# Patient Record
Sex: Female | Born: 1950 | Race: White | Hispanic: No | Smoking: Current every day smoker
Health system: Southern US, Community
[De-identification: ages and names within clinical notes are randomized; demographics above are authoritative.]

## PROBLEM LIST (undated history)

## (undated) DIAGNOSIS — M797 Fibromyalgia: Secondary | ICD-10-CM

## (undated) DIAGNOSIS — N3281 Overactive bladder: Secondary | ICD-10-CM

## (undated) DIAGNOSIS — F32A Depression, unspecified: Secondary | ICD-10-CM

## (undated) DIAGNOSIS — R251 Tremor, unspecified: Secondary | ICD-10-CM

## (undated) DIAGNOSIS — F329 Major depressive disorder, single episode, unspecified: Secondary | ICD-10-CM

## (undated) DIAGNOSIS — F419 Anxiety disorder, unspecified: Secondary | ICD-10-CM

## (undated) DIAGNOSIS — Z923 Personal history of irradiation: Secondary | ICD-10-CM

## (undated) DIAGNOSIS — J449 Chronic obstructive pulmonary disease, unspecified: Secondary | ICD-10-CM

## (undated) DIAGNOSIS — E119 Type 2 diabetes mellitus without complications: Secondary | ICD-10-CM

## (undated) DIAGNOSIS — K219 Gastro-esophageal reflux disease without esophagitis: Secondary | ICD-10-CM

## (undated) DIAGNOSIS — I10 Essential (primary) hypertension: Secondary | ICD-10-CM

## (undated) DIAGNOSIS — C801 Malignant (primary) neoplasm, unspecified: Secondary | ICD-10-CM

## (undated) DIAGNOSIS — G4730 Sleep apnea, unspecified: Secondary | ICD-10-CM

## (undated) DIAGNOSIS — J45909 Unspecified asthma, uncomplicated: Secondary | ICD-10-CM

## (undated) HISTORY — PX: ULNAR NERVE TRANSPOSITION: SHX2595

## (undated) HISTORY — PX: ABDOMINAL HYSTERECTOMY: SHX81

---

## 2002-03-09 ENCOUNTER — Encounter: Payer: Self-pay | Admitting: Internal Medicine

## 2002-03-09 ENCOUNTER — Encounter: Admission: RE | Admit: 2002-03-09 | Discharge: 2002-03-09 | Payer: Self-pay | Admitting: Internal Medicine

## 2003-05-08 ENCOUNTER — Ambulatory Visit (HOSPITAL_COMMUNITY): Admission: RE | Admit: 2003-05-08 | Discharge: 2003-05-08 | Payer: Self-pay | Admitting: Geriatric Medicine

## 2003-10-19 ENCOUNTER — Inpatient Hospital Stay (HOSPITAL_COMMUNITY): Admission: EM | Admit: 2003-10-19 | Discharge: 2003-10-21 | Payer: Self-pay | Admitting: Cardiology

## 2005-03-20 ENCOUNTER — Encounter: Admission: RE | Admit: 2005-03-20 | Discharge: 2005-03-20 | Payer: Self-pay | Admitting: Internal Medicine

## 2007-05-04 ENCOUNTER — Ambulatory Visit: Payer: Self-pay | Admitting: Cardiology

## 2008-10-18 ENCOUNTER — Encounter: Admission: RE | Admit: 2008-10-18 | Discharge: 2008-10-18 | Payer: Self-pay | Admitting: Family Medicine

## 2010-07-19 NOTE — Cardiovascular Report (Signed)
NAMESANTRICE, [name]                          ACCOUNT NO.:  [id number]   MEDICAL RECORD NO.:  [id number]                   PATIENT TYPE:  INP   LOCATION:  4713                                 FACILITY:  [facility]   PHYSICIAN:  Charlies Constable, M.D. [facility]              DATE OF BIRTH:  1950/12/01   DATE OF PROCEDURE:  10/20/2003  DATE OF DISCHARGE:  10/21/2003                              CARDIAC CATHETERIZATION   PROCEDURES:  Cardiac catheterization.   CARDIOLOGIST:  Charlies Constable, M.D.   CLINICAL HISTORY:  [name] is 60 years old and is a smoker and has  hyperlipidemia.  She was admitted with chest pain by Dr. Sherril Croon and seen in  consultation by Dr. Simona Huh who recommended evaluation with coronary  angiography, and she was transferred here for this.   DESCRIPTION OF PROCEDURE:  The procedure was performed via the right femoral  artery using arterial sheath and 6-French preformed coronary catheters.  A  frontal arterial puncture was performed, and Omnipaque contrast was used.   The patient was enrolled in the [facility] trial, and after completion of  the diagnostic study, we prepared to perform intravascular ultrasound on the  right coronary artery.   The patient was given weight-adjusted heparin to prolong the ACT to greater  than 200 seconds.  We used A JR4 6-French guiding catheter with side holes  and a short Asahi soft wire.  We passed the wire down the distal right  coronary artery without difficulty.  After giving intracoronary  nitroglycerin, we passed an Atlantis IVUS catheter about two-thirds the way  down the vessel in the vicinity of the takeoff of a large right ventricular  branch.  We then did automatic pullback into the guiding catheter.  The  patient tolerated the procedure well and left the laboratory in satisfactory  condition.   RESULTS:  The aortic pressure was 129/67 with a mean of 94.  The left  ventricular pressure was 129/18.   1. LEFT MAIN CORONARY  ARTERY:  The left main coronary artery was free fo     significant disease.  2. LEFT ANTERIOR DESCENDING ARTERY:  The left anterior descending artery     gave rise to a septal perforator and two diagonal branches.  These and     the LAD proper were free of significant disease.  3. CIRCUMFLEX ARTERY:  The circumflex artery gave rise to two marginal     branches, an atrial branch, and a posterolateral branch.  These vessels     were free of significant disease.  4. RIGHT CORONARY ARTERY:  The right coronary artery gave rise to a conus     branch, two right ventricular branches, and acute marginal branch,     posterior descending branch, and a posterolateral branch.  The right     coronary artery was diffusely irregular.  There was 30% narrowing in the     mid  vessel.  There was 40% narrowing in the mid to distal vessel with     some mild calcification.  5. LEFT VENTRICULOGRAM: The left ventriculogram performed in the RAO     projection showed good wall motion with no areas of hypokinesis.   The intravascular ultrasound showed moderate calcified plaque in the mid to  distal vessel which narrowed the lumen by about 50%.  More proximally, there  was moderate uncalcified plaque.  The total IVUS run was approximately 62  mm, and the last 32 mm of the run was free of calcification.   CONCLUSION:  1. Nonobstructive coronary artery disease with  30% mid and 40% mid to     distal stenoses in the right coronary artery, no significant obstruction     in the circumflex and left anterior descending arteries, and normal left     ventricular function.  2. Intravascular ultrasound performed as part of the [employer] trial.   DISPOSITION:  The patient was returned to post catheterization area for  further observation.  If her IVUS qualifies as part of the [employer]  trial, she will be randomized to either rimonabant or a placebo with  followup catheterization scheduled for two years.                                                Charlies Constable, M.D. [facility]    BB/MEDQ  D:  10/20/2003  T:  10/21/2003  Job:  [id number]   cc:   Wende Crease, M.D.   Jonelle Sidle, M.D. [facility]   Cardiopulmonary Lab

## 2010-07-19 NOTE — Discharge Summary (Signed)
Charlotte Howell, Charlotte Howell                          ACCOUNT NO.:  [id number]   MEDICAL RECORD NO.:  [id number]                   PATIENT TYPE:  INP   LOCATION:  4713                                 FACILITY:  [facility]   PHYSICIAN:  Arturo Morton. Riley Kill, M.D. [facility]         DATE OF BIRTH:  1950/04/20   DATE OF ADMISSION:  10/19/2003  DATE OF DISCHARGE:  10/21/2003                           DISCHARGE SUMMARY - REFERRING   HISTORY OF PRESENT ILLNESS:  Charlotte Howell is a 60 year old white female who was  admitted to [facility] for observation by Dr. Sherril Croon with the recent  history of chest discomfort described as a pressure.  She stated that this  developed while driving home from work on the day prior to admission and  persisted throughout the evening and into the morning prompting further  assessment.  She has also noted some increased dysphagia with liquids and  solids, exacerbation of her chest discomfort with activity and also some  shortness of breath.   She has a history of GERD but no recent assessment.  Ongoing tobacco use,  hyperlipidemia treated with red yeast rice, and hypertension.   LABORATORY DATA:  At [facility], sodium was 138, potassium 4.3, BUN  8, creatinine 0.7, glucose 105.  Normal LFTs.  CK-MBs and troponins negative  for myocardial infarction.  PT 12.4, PTT 24.8.  H&H 14.5 and 42.7.  Normal  indices.  Platelets 268,000.  WBCs at 11.6.  Subsequent labs at [facility] showed a fasting lipid profile with a total cholesterol of  234, triglycerides 308, HDL 43, LDL 129.  Subsequent CK-MBs were also  negative for myocardial infarction.   Chest x-ray at [facility] was within normal limits.  EKG showed  normal sinus rhythm, normal axis, nonspecific ST and T-wave changes.   HOSPITAL COURSE:  Charlotte Howell was transferred to [facility]  for cardiac catheterization for definitive diagnosis.  She did have some  recurring chest discomfort  but she refused nitroglycerin.  On October 20, 2003, Dr. Charlies Constable performed cardiac catheterization.  This showed an EF  of 60%, mid-30% and 40% RCA lesions.  He felt she had nonobstructing  coronary artery disease.  This should be treated aggressively for cardiac  risk factors such as hyperlipidemia and smoking cessation.   By the morning of October 21, 2003, she was ambulating without difficulty.  Catheterization site was intact.  Outpatient taught tobacco cessation.  Consult will be obtained.   DISCHARGE DIAGNOSES:  1. Atypical chest discomfort with nonobstructive coronary artery disease     documented on cardiac catheterization.  2. Hypertension prior to admission.  3. Hyperlipidemia.  4. Tobacco use.  5. Gastroesophageal reflux disease with dysphagia.  6. She received new prescriptions for baby aspirin 81 mg q.d.  7. Zocor 40 mg q.h.s.  8. Wellbutrin 150 mg b.i.d. for seven weeks.  9. She was asked to continue her Zantac 150  mg b.i.d.  10.      Continue Propranolol, Albuterol, Advair as previously, and her red     yeast rice.   ACTIVITY:  She was advised no lifting, driving, sexual activity, or heavy  exertion for two days.   DIET:  Maintain low salt, fat, cholesterol diet.   DISCHARGE INSTRUCTIONS:  She was advised if she had any problems with her  catheterization site to please call our [facility] office for problems.  She is  advised no smoking or tobacco use and explained the philosophy behind  Wellbutrin.  She was asked to arrange a follow-up one to two week  appointment with Dr. Forrest Moron.  At the time of follow-up with Dr. Forrest Moron, her primary care physician, consideration should be given to  arrangements of six to eight weeks lipid and liver check since Zocor was  initiated as well as outpatient carotid dopplers.  Dr. Jonelle Sidle  felt that he possibly heard a carotid bruit on exam; however, this was not  appreciated at the time of discharge.       Charlotte Howell, P.A. [facility]                    Thomas D. Riley Kill, M.D. [facility]    EW/MEDQ  D:  10/21/2003  T:  10/21/2003  Job:  [id number]   cc:   Sherril Croon, M.D.  [place], [place]   Jonelle Sidle, M.D. [facility]   Forrest Moron, M.D.  [place], [place]

## 2014-07-14 ENCOUNTER — Other Ambulatory Visit: Payer: Self-pay | Admitting: Surgery

## 2014-07-14 ENCOUNTER — Ambulatory Visit: Payer: Self-pay | Admitting: Surgery

## 2014-07-14 DIAGNOSIS — C50911 Malignant neoplasm of unspecified site of right female breast: Secondary | ICD-10-CM

## 2014-07-14 DIAGNOSIS — R922 Inconclusive mammogram: Secondary | ICD-10-CM

## 2014-07-14 DIAGNOSIS — Z853 Personal history of malignant neoplasm of breast: Secondary | ICD-10-CM

## 2014-07-18 ENCOUNTER — Ambulatory Visit
Admission: RE | Admit: 2014-07-18 | Discharge: 2014-07-18 | Disposition: A | Discharge status: Home / Self Care | Payer: PRIVATE HEALTH INSURANCE | Source: Ambulatory Visit | Attending: Surgery | Admitting: Surgery

## 2014-07-18 MED ORDER — GADOBENATE DIMEGLUMINE 529 MG/ML IV SOLN
15.0000 mL | Freq: Once | INTRAVENOUS | Status: AC | PRN
  Administered 2014-07-18: 15 mL via INTRAVENOUS

## 2014-07-21 ENCOUNTER — Other Ambulatory Visit: Payer: Self-pay | Admitting: Surgery

## 2014-07-21 DIAGNOSIS — R928 Other abnormal and inconclusive findings on diagnostic imaging of breast: Secondary | ICD-10-CM

## 2014-08-22 ENCOUNTER — Ambulatory Visit
Admission: RE | Admit: 2014-08-22 | Discharge: 2014-08-22 | Disposition: A | Discharge status: Home / Self Care | Payer: PRIVATE HEALTH INSURANCE | Source: Ambulatory Visit | Attending: Surgery | Admitting: Surgery

## 2014-08-22 ENCOUNTER — Other Ambulatory Visit: Payer: Self-pay | Admitting: Surgery

## 2014-08-22 DIAGNOSIS — R928 Other abnormal and inconclusive findings on diagnostic imaging of breast: Secondary | ICD-10-CM

## 2014-08-22 MED ORDER — GADOBENATE DIMEGLUMINE 529 MG/ML IV SOLN
15.0000 mL | Freq: Once | INTRAVENOUS | Status: AC | PRN
  Administered 2014-08-22: 15 mL via INTRAVENOUS

## 2014-08-24 ENCOUNTER — Other Ambulatory Visit: Payer: PRIVATE HEALTH INSURANCE

## 2014-08-29 ENCOUNTER — Ambulatory Visit: Payer: Self-pay | Admitting: Surgery

## 2014-08-29 DIAGNOSIS — C50911 Malignant neoplasm of unspecified site of right female breast: Secondary | ICD-10-CM

## 2014-08-29 NOTE — H&P (Signed)
[facility] 08/29/2014 4:11 PM Location: [facility] Patient #: [id number] DOB: 11/15/1950 Widowed / Language: [name] / Race: White Female History of Present Illness [name] A. [name] MD; 08/29/2014 5:07 PM) Patient words: discuss surgery  PT HERE FOR FOLLOW UP OF STAGE 1 RIGHT BREAST CANCER. SHE HAD 2 MORE RIGHT BREAST BIOPSIES AND THESE WERE FIBROCYSTIC DISEASE. sHE WOULD LIKE TO PROCEED WITH RIGHT BREAST PARTIAL MASTECTOMY AND sln MAPPING OVER MASTECTOMY.            1. Breast, right, needle core biopsy, UOQ - FIBROCYSTIC CHANGES. - THERE IS NO EVIDENCE OF MALIGNANCY. - SEE COMMENT. 2. Breast, right, needle core biopsy, UIQ - FIBROADENOMA. - THERE IS NO EVIDENCE OF MALIGNANCY. - SEE COMMENT.             CLINICAL DATA: Newly diagnosed right breast 12 o'clock location malignancy at [place], [place].  LABS: BUN and creatinine were obtained on site at [place] at  [address]  Results: BUN for mg/dL, Creatinine 0.7 mg/dL.  EXAM: BILATERAL BREAST MRI WITH AND WITHOUT CONTRAST  TECHNIQUE: Multiplanar, multisequence MR images of both breasts were obtained prior to and following the intravenous administration of 15 ml of MultiHance.  THREE-DIMENSIONAL MR IMAGE RENDERING ON INDEPENDENT WORKSTATION:  Three-dimensional MR images were rendered by post-processing of the original MR data on an independent workstation. The three-dimensional MR images were interpreted, and findings are reported in the following complete MRI report for this study. Three dimensional images were evaluated at the independent DynaCad workstation  COMPARISON: Outside prior mammograms and right breast ultrasound  FINDINGS: Breast composition: d. Extreme fibroglandular tissue.  Background parenchymal enhancement: Mild  Right breast: In the right breast 12 o'clock location is an area of irregular masslike enhancement measuring 1.9 by  0.6 x 1.4 cm, demonstrating progressive enhancement type kinetics. This contains central clip artifact and corresponds to the biopsy-proven malignancy. In the right breast 10 o'clock location, 1 cm lateral to this biopsy proven malignancy, is a 0.6 cm area of irregular masslike enhancement also demonstrating similar progressive enhancement type kinetics.  Left breast: No mass or abnormal enhancement.  Lymph nodes: No abnormal appearing lymph nodes.  Ancillary findings: None.  IMPRESSION: Dominant 1.9 cm area of masslike abnormal enhancement right breast 12 o'clock location corresponding to biopsy-proven malignancy.  In the right breast 10 o'clock location, located 1 cm lateral to the dominant right breast 12 o'clock location mass, is a 0.6 cm irregular area of masslike enhancement. This could represent multi focal malignancy or reactive versus metastatic intramammary lymph node. Consider MRI guided breast biopsy if breast conservation is considered. The patient previously underwent sonographic evaluation of this general location and no sonographic abnormality was reportedly previously seen.  No MRI evidence for malignancy in the left breast.  RECOMMENDATION: MRI guided right breast biopsy  BI-RADS CATEGORY 6: Known biopsy-proven malignancy.   Electronically Signed By: [name] M.D. On: 07/18/2014 15:40.  The patient is a 64 year old female   Medication History Ventura Sellers, CMA; 08/29/2014 4:16 PM) ALPRAZolam (1MG  Tablet, Oral) Active. TraMADol HCl (50MG  Tablet, Oral) Active. Cyclobenzaprine HCl (10MG  Tablet, Oral) Active. Dexilant (60MG  Capsule DR, Oral) Active. Myrbetriq (25MG  Tablet ER 24HR, Oral) Active. Primidone (50MG  Tablet, Oral) Active. ROPINIRole HCl (0.5MG  Tablet, Oral) Active. Symbicort (160-4.5MCG/ACT Aerosol, Inhalation) Active. ZyrTEC Allergy (10MG  Capsule, Oral) Active. Albuterol Sulfate (0.63MG /3ML Nebulized Soln,  Inhalation) Active. Aspirin (81MG  Tablet, Oral) Active. Tylenol Extra Strength (500MG  Tablet, Oral) Active. Hydrochlorothiazide (25MG  Tablet, Oral) Active.  MetFORMIN HCl (500MG  Tablet, Oral two times daily) Active. Red Yeast Rice Extract (600MG  Capsule, Oral daily) Active. Medications Reconciled    Vitals [name] CMA; 08/29/2014 4:12 PM) 08/29/2014 4:12 PM Weight: 159.8 lb Height: 64in Body Surface Area: 1.81 m Body Mass Index: 27.43 kg/m Temp.: 97.7F(Oral)  Pulse: 100 (Regular)  BP: 138/76 (Sitting, Left Arm, Standard)     Physical Exam ([name] A. Katiria Calame MD; 08/29/2014 5:08 PM)  General Mental Status-Alert. General Appearance-Consistent with stated age. Hydration-Well hydrated. Voice-Normal.  Eye Eyeball - Bilateral-Extraocular movements intact. Sclera/Conjunctiva - Bilateral-No scleral icterus.  Breast Note: NOT REEXAMINED TODAY   Neurologic Neurologic evaluation reveals -alert and oriented x 3 with no impairment of recent or remote memory. Mental Status-Normal.  Musculoskeletal Normal Exam - Left-Upper Extremity Strength Normal and Lower Extremity Strength Normal. Normal Exam - Right-Upper Extremity Strength Normal and Lower Extremity Strength Normal.  Lymphatic Note: NO CHANGE FROM PREVIOUS EXAM     Assessment & Plan ([name] A. Zoria Rawlinson MD; 08/29/2014 5:09 PM)  BREAST CANCER, RIGHT (174.9  C50.911) Impression: 1.9 CM ON MRI er/pr + UPPER OUTER QUADRANT THE OTHER SITES IN RIGHT BREAST ARE FIBROCYSTIC DISEASE PT DESIRES BREAST CONSERVATION AND WANTS HER CARE IN [place] WILL REFER TO DR Tressie Stalker IN [place] AND CAN GET RADIATION THERAPY IN [place]    Risk of lumpectomy include bleeding, infection, seroma, more surgery, use of seed/wire, wound care, cosmetic deformity and the need for other treatments, death , blood clots, death. Pt agrees to proceed. Risk of sentinel lymph node mapping include bleeding,  infection, lymphedema, shoulder pain. stiffness, dye allergy. cosmetic deformity , blood clots, death, need for more surgery. Pt agres to proceed.  Current Plans Pt Education - Breast Cancer: discussed with patient and provided information. Pt Education - CCS Breast Biopsy HCI Pt Education - CSS Breast Biopsy Instructions (FLB): discussed with patient and provided information.

## 2014-09-13 ENCOUNTER — Other Ambulatory Visit: Payer: Self-pay | Admitting: Surgery

## 2014-09-13 DIAGNOSIS — C50911 Malignant neoplasm of unspecified site of right female breast: Secondary | ICD-10-CM

## 2014-09-18 ENCOUNTER — Encounter (HOSPITAL_BASED_OUTPATIENT_CLINIC_OR_DEPARTMENT_OTHER): Payer: Self-pay | Admitting: *Deleted

## 2014-09-20 ENCOUNTER — Encounter (HOSPITAL_BASED_OUTPATIENT_CLINIC_OR_DEPARTMENT_OTHER)
Admission: RE | Admit: 2014-09-20 | Discharge: 2014-09-20 | Disposition: A | Discharge status: Home / Self Care | Payer: No Typology Code available for payment source | Source: Ambulatory Visit | Attending: Surgery | Admitting: Surgery

## 2014-09-20 ENCOUNTER — Ambulatory Visit
Admission: RE | Admit: 2014-09-20 | Discharge: 2014-09-20 | Disposition: A | Discharge status: Home / Self Care | Payer: PRIVATE HEALTH INSURANCE | Source: Ambulatory Visit | Attending: Surgery | Admitting: Surgery

## 2014-09-20 DIAGNOSIS — C50911 Malignant neoplasm of unspecified site of right female breast: Secondary | ICD-10-CM | POA: Diagnosis present

## 2014-09-20 DIAGNOSIS — C50411 Malignant neoplasm of upper-outer quadrant of right female breast: Secondary | ICD-10-CM | POA: Diagnosis not present

## 2014-09-20 DIAGNOSIS — F17210 Nicotine dependence, cigarettes, uncomplicated: Secondary | ICD-10-CM | POA: Diagnosis not present

## 2014-09-20 DIAGNOSIS — J449 Chronic obstructive pulmonary disease, unspecified: Secondary | ICD-10-CM | POA: Diagnosis not present

## 2014-09-20 DIAGNOSIS — E119 Type 2 diabetes mellitus without complications: Secondary | ICD-10-CM | POA: Diagnosis not present

## 2014-09-20 DIAGNOSIS — G709 Myoneural disorder, unspecified: Secondary | ICD-10-CM | POA: Diagnosis not present

## 2014-09-20 DIAGNOSIS — I10 Essential (primary) hypertension: Secondary | ICD-10-CM | POA: Diagnosis not present

## 2014-09-20 DIAGNOSIS — J45909 Unspecified asthma, uncomplicated: Secondary | ICD-10-CM | POA: Diagnosis not present

## 2014-09-20 DIAGNOSIS — Z170 Estrogen receptor positive status [ER+]: Secondary | ICD-10-CM | POA: Diagnosis not present

## 2014-09-20 DIAGNOSIS — M797 Fibromyalgia: Secondary | ICD-10-CM | POA: Diagnosis not present

## 2014-09-20 DIAGNOSIS — Z79899 Other long term (current) drug therapy: Secondary | ICD-10-CM | POA: Diagnosis not present

## 2014-09-20 HISTORY — PX: BREAST LUMPECTOMY: SHX2

## 2014-09-20 LAB — CBC WITH DIFFERENTIAL/PLATELET
BASOS ABS: 0 10*3/uL (ref 0.0–0.1)
Basophils Relative: 0 % (ref 0–1)
EOS ABS: 0.1 10*3/uL (ref 0.0–0.7)
EOS PCT: 1 % (ref 0–5)
HEMATOCRIT: 38.8 % (ref 36.0–46.0)
Hemoglobin: 13.4 g/dL (ref 12.0–15.0)
LYMPHS PCT: 28 % (ref 12–46)
Lymphs Abs: 2.5 10*3/uL (ref 0.7–4.0)
MCH: 30.8 pg (ref 26.0–34.0)
MCHC: 34.5 g/dL (ref 30.0–36.0)
MCV: 89.2 fL (ref 78.0–100.0)
Monocytes Absolute: 0.8 10*3/uL (ref 0.1–1.0)
Monocytes Relative: 9 % (ref 3–12)
NEUTROS PCT: 62 % (ref 43–77)
Neutro Abs: 5.4 10*3/uL (ref 1.7–7.7)
Platelets: 248 10*3/uL (ref 150–400)
RBC: 4.35 MIL/uL (ref 3.87–5.11)
RDW: 12.9 % (ref 11.5–15.5)
WBC: 8.8 10*3/uL (ref 4.0–10.5)

## 2014-09-20 LAB — COMPREHENSIVE METABOLIC PANEL
ALT: 21 U/L (ref 14–54)
AST: 23 U/L (ref 15–41)
Albumin: 3.4 g/dL — ABNORMAL LOW (ref 3.5–5.0)
Alkaline Phosphatase: 96 U/L (ref 38–126)
Anion gap: 10 (ref 5–15)
BUN: 6 mg/dL (ref 6–20)
CHLORIDE: 93 mmol/L — AB (ref 101–111)
CO2: 27 mmol/L (ref 22–32)
Calcium: 8.8 mg/dL — ABNORMAL LOW (ref 8.9–10.3)
Creatinine, Ser: 0.94 mg/dL (ref 0.44–1.00)
GFR calc Af Amer: >60 mL/min (ref >60)
GFR calc non Af Amer: >60 mL/min (ref >60)
Glucose, Bld: 164 mg/dL — ABNORMAL HIGH (ref 65–99)
Potassium: 3.1 mmol/L — ABNORMAL LOW (ref 3.5–5.1)
SODIUM: 130 mmol/L — AB (ref 135–145)
TOTAL PROTEIN: 6.7 g/dL (ref 6.5–8.1)
Total Bilirubin: 0.4 mg/dL (ref 0.3–1.2)

## 2014-09-21 ENCOUNTER — Encounter (HOSPITAL_COMMUNITY)
Admission: RE | Admit: 2014-09-21 | Discharge: 2014-09-21 | Disposition: A | Discharge status: Home / Self Care | Payer: No Typology Code available for payment source | Source: Ambulatory Visit | Attending: Surgery | Admitting: Surgery

## 2014-09-21 ENCOUNTER — Ambulatory Visit (HOSPITAL_BASED_OUTPATIENT_CLINIC_OR_DEPARTMENT_OTHER): Payer: No Typology Code available for payment source | Admitting: Anesthesiology

## 2014-09-21 ENCOUNTER — Encounter (HOSPITAL_BASED_OUTPATIENT_CLINIC_OR_DEPARTMENT_OTHER)
Admission: RE | Disposition: A | Discharge status: Home / Self Care | Payer: Self-pay | Source: Ambulatory Visit | Attending: Surgery

## 2014-09-21 ENCOUNTER — Encounter (HOSPITAL_BASED_OUTPATIENT_CLINIC_OR_DEPARTMENT_OTHER): Payer: Self-pay

## 2014-09-21 ENCOUNTER — Ambulatory Visit (HOSPITAL_BASED_OUTPATIENT_CLINIC_OR_DEPARTMENT_OTHER)
Admission: RE | Admit: 2014-09-21 | Discharge: 2014-09-21 | Disposition: A | Discharge status: Home / Self Care | Payer: No Typology Code available for payment source | Source: Ambulatory Visit | Attending: Surgery | Admitting: Surgery

## 2014-09-21 ENCOUNTER — Ambulatory Visit
Admission: RE | Admit: 2014-09-21 | Discharge: 2014-09-21 | Disposition: A | Discharge status: Home / Self Care | Payer: PRIVATE HEALTH INSURANCE | Source: Ambulatory Visit | Attending: Surgery | Admitting: Surgery

## 2014-09-21 DIAGNOSIS — C50411 Malignant neoplasm of upper-outer quadrant of right female breast: Secondary | ICD-10-CM | POA: Diagnosis not present

## 2014-09-21 DIAGNOSIS — F17210 Nicotine dependence, cigarettes, uncomplicated: Secondary | ICD-10-CM | POA: Insufficient documentation

## 2014-09-21 DIAGNOSIS — Z170 Estrogen receptor positive status [ER+]: Secondary | ICD-10-CM | POA: Insufficient documentation

## 2014-09-21 DIAGNOSIS — M797 Fibromyalgia: Secondary | ICD-10-CM | POA: Insufficient documentation

## 2014-09-21 DIAGNOSIS — I10 Essential (primary) hypertension: Secondary | ICD-10-CM | POA: Insufficient documentation

## 2014-09-21 DIAGNOSIS — E119 Type 2 diabetes mellitus without complications: Secondary | ICD-10-CM | POA: Insufficient documentation

## 2014-09-21 DIAGNOSIS — J45909 Unspecified asthma, uncomplicated: Secondary | ICD-10-CM | POA: Insufficient documentation

## 2014-09-21 DIAGNOSIS — C50911 Malignant neoplasm of unspecified site of right female breast: Secondary | ICD-10-CM

## 2014-09-21 DIAGNOSIS — J449 Chronic obstructive pulmonary disease, unspecified: Secondary | ICD-10-CM | POA: Insufficient documentation

## 2014-09-21 DIAGNOSIS — Z79899 Other long term (current) drug therapy: Secondary | ICD-10-CM | POA: Insufficient documentation

## 2014-09-21 DIAGNOSIS — G709 Myoneural disorder, unspecified: Secondary | ICD-10-CM | POA: Insufficient documentation

## 2014-09-21 HISTORY — DX: Essential (primary) hypertension: I10

## 2014-09-21 HISTORY — DX: Malignant (primary) neoplasm, unspecified: C80.1

## 2014-09-21 HISTORY — DX: Unspecified asthma, uncomplicated: J45.909

## 2014-09-21 HISTORY — DX: Tremor, unspecified: R25.1

## 2014-09-21 HISTORY — DX: Gastro-esophageal reflux disease without esophagitis: K21.9

## 2014-09-21 HISTORY — DX: Fibromyalgia: M79.7

## 2014-09-21 HISTORY — DX: Depression, unspecified: F32.A

## 2014-09-21 HISTORY — DX: Anxiety disorder, unspecified: F41.9

## 2014-09-21 HISTORY — DX: Overactive bladder: N32.81

## 2014-09-21 HISTORY — PX: RADIOACTIVE SEED GUIDED PARTIAL MASTECTOMY WITH AXILLARY SENTINEL LYMPH NODE BIOPSY: SHX6520

## 2014-09-21 HISTORY — DX: Type 2 diabetes mellitus without complications: E11.9

## 2014-09-21 HISTORY — DX: Chronic obstructive pulmonary disease, unspecified: J44.9

## 2014-09-21 HISTORY — DX: Sleep apnea, unspecified: G47.30

## 2014-09-21 HISTORY — DX: Major depressive disorder, single episode, unspecified: F32.9

## 2014-09-21 LAB — GLUCOSE, CAPILLARY
Glucose-Capillary: 174 mg/dL — ABNORMAL HIGH (ref 65–99)
Glucose-Capillary: 249 mg/dL — ABNORMAL HIGH (ref 65–99)

## 2014-09-21 SURGERY — RADIOACTIVE SEED GUIDED PARTIAL MASTECTOMY WITH AXILLARY SENTINEL LYMPH NODE BIOPSY
Anesthesia: General | Laterality: Right

## 2014-09-21 MED ORDER — LIDOCAINE HCL (CARDIAC) 20 MG/ML IV SOLN
INTRAVENOUS | Status: DC | PRN
  Administered 2014-09-21: 100 mg via INTRAVENOUS

## 2014-09-21 MED ORDER — FENTANYL CITRATE (PF) 100 MCG/2ML IJ SOLN
INTRAMUSCULAR | Status: AC
  Filled 2014-09-21: qty 4

## 2014-09-21 MED ORDER — MIDAZOLAM HCL 2 MG/2ML IJ SOLN
INTRAMUSCULAR | Status: AC
  Filled 2014-09-21: qty 2

## 2014-09-21 MED ORDER — CIPROFLOXACIN IN D5W 400 MG/200ML IV SOLN
INTRAVENOUS | Status: AC
  Filled 2014-09-21: qty 200

## 2014-09-21 MED ORDER — FENTANYL CITRATE (PF) 100 MCG/2ML IJ SOLN
50.0000 ug | INTRAMUSCULAR | Status: AC | PRN
  Administered 2014-09-21 (×2): 25 ug via INTRAVENOUS
  Administered 2014-09-21: 100 ug via INTRAVENOUS
  Administered 2014-09-21 (×2): 25 ug via INTRAVENOUS

## 2014-09-21 MED ORDER — SCOPOLAMINE 1 MG/3DAYS TD PT72: 1.0000 | MEDICATED_PATCH | Freq: Once | TRANSDERMAL | Status: DC | PRN

## 2014-09-21 MED ORDER — CIPROFLOXACIN IN D5W 400 MG/200ML IV SOLN
INTRAVENOUS | Status: DC | PRN
  Administered 2014-09-21: 400 mg via INTRAVENOUS

## 2014-09-21 MED ORDER — SODIUM CHLORIDE 0.9 % IJ SOLN
INTRAMUSCULAR | Status: DC | PRN
  Administered 2014-09-21: 5 mL

## 2014-09-21 MED ORDER — HYDROMORPHONE HCL 1 MG/ML IJ SOLN
0.2500 mg | INTRAMUSCULAR | Status: DC | PRN
  Administered 2014-09-21 (×2): 0.5 mg via INTRAVENOUS

## 2014-09-21 MED ORDER — TECHNETIUM TC 99M SULFUR COLLOID FILTERED
1.0000 | Freq: Once | INTRAVENOUS | Status: AC | PRN
  Administered 2014-09-21: 1 via INTRADERMAL

## 2014-09-21 MED ORDER — DEXAMETHASONE SODIUM PHOSPHATE 4 MG/ML IJ SOLN
INTRAMUSCULAR | Status: DC | PRN
  Administered 2014-09-21: 5 mg via INTRAVENOUS

## 2014-09-21 MED ORDER — ONDANSETRON HCL 4 MG/2ML IJ SOLN
INTRAMUSCULAR | Status: DC | PRN
  Administered 2014-09-21: 4 mg via INTRAVENOUS

## 2014-09-21 MED ORDER — PROPOFOL 10 MG/ML IV BOLUS
INTRAVENOUS | Status: DC | PRN
  Administered 2014-09-21: 160 mg via INTRAVENOUS

## 2014-09-21 MED ORDER — LACTATED RINGERS IV SOLN
INTRAVENOUS | Status: DC
  Administered 2014-09-21 (×2): via INTRAVENOUS

## 2014-09-21 MED ORDER — GLYCOPYRROLATE 0.2 MG/ML IJ SOLN: 0.2000 mg | Freq: Once | INTRAMUSCULAR | Status: DC | PRN

## 2014-09-21 MED ORDER — FENTANYL CITRATE (PF) 100 MCG/2ML IJ SOLN
INTRAMUSCULAR | Status: AC
  Filled 2014-09-21: qty 2

## 2014-09-21 MED ORDER — HYDROMORPHONE HCL 1 MG/ML IJ SOLN
INTRAMUSCULAR | Status: AC
  Filled 2014-09-21: qty 1

## 2014-09-21 MED ORDER — MIDAZOLAM HCL 2 MG/2ML IJ SOLN
1.0000 mg | INTRAMUSCULAR | Status: DC | PRN
  Administered 2014-09-21 (×2): 1 mg via INTRAVENOUS

## 2014-09-21 MED ORDER — OXYCODONE-ACETAMINOPHEN 5-325 MG PO TABS: 1.0000 | ORAL_TABLET | ORAL | Status: AC | PRN

## 2014-09-21 MED ORDER — CEFAZOLIN SODIUM-DEXTROSE 2-3 GM-% IV SOLR
INTRAVENOUS | Status: AC
  Filled 2014-09-21: qty 50

## 2014-09-21 MED ORDER — BUPIVACAINE-EPINEPHRINE (PF) 0.25% -1:200000 IJ SOLN
INTRAMUSCULAR | Status: DC | PRN
Start: 2014-09-21 — End: 2014-09-21
  Administered 2014-09-21: 6.5 mL

## 2014-09-21 MED ORDER — DEXTROSE 5 % IV SOLN: 3.0000 g | INTRAVENOUS | Status: DC

## 2014-09-21 MED ORDER — PROMETHAZINE HCL 25 MG/ML IJ SOLN
6.2500 mg | INTRAMUSCULAR | Status: DC | PRN
Start: 2014-09-21 — End: 2014-09-21

## 2014-09-21 SURGICAL SUPPLY — 47 items
APPLIER CLIP 9.375 MED OPEN (MISCELLANEOUS) ×3
APR CLP MED 9.3 20 MLT OPN (MISCELLANEOUS) ×1
BINDER BREAST LRG (GAUZE/BANDAGES/DRESSINGS) ×2 IMPLANT
BINDER BREAST MEDIUM (GAUZE/BANDAGES/DRESSINGS) IMPLANT
BINDER BREAST XLRG (GAUZE/BANDAGES/DRESSINGS) IMPLANT
BINDER BREAST XXLRG (GAUZE/BANDAGES/DRESSINGS) IMPLANT
BLADE SURG 15 STRL LF DISP TIS (BLADE) ×1 IMPLANT
BLADE SURG 15 STRL SS (BLADE) ×3
CANISTER SUCT 1200ML W/VALVE (MISCELLANEOUS) ×3 IMPLANT
CHLORAPREP W/TINT 26ML (MISCELLANEOUS) ×3 IMPLANT
CLIP APPLIE 9.375 MED OPEN (MISCELLANEOUS) ×1 IMPLANT
COVER BACK TABLE 60X90IN (DRAPES) ×3 IMPLANT
COVER MAYO STAND STRL (DRAPES) ×3 IMPLANT
COVER PROBE W GEL 5X96 (DRAPES) ×3 IMPLANT
DECANTER SPIKE VIAL GLASS SM (MISCELLANEOUS) IMPLANT
DRAPE LAPAROSCOPIC ABDOMINAL (DRAPES) ×3 IMPLANT
DRAPE UTILITY XL STRL (DRAPES) ×3 IMPLANT
ELECT COATED BLADE 2.86 ST (ELECTRODE) ×3 IMPLANT
ELECT REM PT RETURN 9FT ADLT (ELECTROSURGICAL) ×3
ELECTRODE REM PT RTRN 9FT ADLT (ELECTROSURGICAL) ×1 IMPLANT
GLOVE BIO SURGEON STRL SZ7 (GLOVE) ×4 IMPLANT
GLOVE BIOGEL PI IND STRL 8 (GLOVE) ×1 IMPLANT
GLOVE BIOGEL PI INDICATOR 8 (GLOVE) ×2
GLOVE ECLIPSE 6.5 STRL STRAW (GLOVE) ×3 IMPLANT
GLOVE ECLIPSE 8.0 STRL XLNG CF (GLOVE) ×3 IMPLANT
GOWN STRL REUS W/ TWL LRG LVL3 (GOWN DISPOSABLE) ×2 IMPLANT
GOWN STRL REUS W/TWL LRG LVL3 (GOWN DISPOSABLE) ×6
HEMOSTAT SNOW SURGICEL 2X4 (HEMOSTASIS) IMPLANT
KIT MARKER MARGIN INK (KITS) ×3 IMPLANT
LIQUID BAND (GAUZE/BANDAGES/DRESSINGS) ×3 IMPLANT
NDL HYPO 25X1 1.5 SAFETY (NEEDLE) ×1 IMPLANT
NDL SAFETY ECLIPSE 18X1.5 (NEEDLE) IMPLANT
NEEDLE HYPO 18GX1.5 SHARP (NEEDLE) ×3
NEEDLE HYPO 25X1 1.5 SAFETY (NEEDLE) ×3 IMPLANT
NS IRRIG 1000ML POUR BTL (IV SOLUTION) ×3 IMPLANT
PACK BASIN DAY SURGERY FS (CUSTOM PROCEDURE TRAY) ×3 IMPLANT
PENCIL BUTTON HOLSTER BLD 10FT (ELECTRODE) ×3 IMPLANT
SLEEVE SCD COMPRESS KNEE MED (MISCELLANEOUS) ×3 IMPLANT
SPONGE LAP 4X18 X RAY DECT (DISPOSABLE) ×5 IMPLANT
SUT MNCRL AB 4-0 PS2 18 (SUTURE) ×3 IMPLANT
SUT VICRYL 3-0 CR8 SH (SUTURE) ×3 IMPLANT
SYR CONTROL 10ML LL (SYRINGE) ×3 IMPLANT
TOWEL OR 17X24 6PK STRL BLUE (TOWEL DISPOSABLE) ×3 IMPLANT
TOWEL OR NON WOVEN STRL DISP B (DISPOSABLE) ×3 IMPLANT
TUBE CONNECTING 20'X1/4 (TUBING) ×1
TUBE CONNECTING 20X1/4 (TUBING) ×2 IMPLANT
YANKAUER SUCT BULB TIP NO VENT (SUCTIONS) ×3 IMPLANT

## 2014-09-21 NOTE — Discharge Instructions (Signed)
[facility] °Office Phone Number [phone] ° °BREAST BIOPSY/ PARTIAL MASTECTOMY: POST OP INSTRUCTIONS ° °Always review your discharge instruction sheet given to you by the facility where your surgery was performed. ° °IF YOU HAVE DISABILITY OR FAMILY LEAVE FORMS, YOU MUST BRING THEM TO THE OFFICE FOR PROCESSING.  DO NOT GIVE THEM TO YOUR DOCTOR. ° °1. A prescription for pain medication may be given to you upon discharge.  Take your pain medication as prescribed, if needed.  If narcotic pain medicine is not needed, then you may take acetaminophen (Tylenol) or ibuprofen (Advil) as needed. °2. Take your usually prescribed medications unless otherwise directed °3. If you need a refill on your pain medication, please contact your pharmacy.  They will contact our office to request authorization.  Prescriptions will not be filled after 5pm or on week-ends. °4. You should eat very light the first 24 hours after surgery, such as soup, crackers, pudding, etc.  Resume your normal diet the day after surgery. °5. Most patients will experience some swelling and bruising in the breast.  Ice packs and a good support bra will help.  Swelling and bruising can take several days to resolve.  °6. It is common to experience some constipation if taking pain medication after surgery.  Increasing fluid intake and taking a stool softener will usually help or prevent this problem from occurring.  A mild laxative (Milk of Magnesia or Miralax) should be taken according to package directions if there are no bowel movements after 48 hours. °7. Unless discharge instructions indicate otherwise, you may remove your bandages 24-48 hours after surgery, and you may shower at that time.  You may have steri-strips (small skin tapes) in place directly over the incision.  These strips should be left on the skin for 7-10 days.  If your surgeon used skin glue on the incision, you may shower in 24 hours.  The glue will flake off over the  next 2-3 weeks.  Any sutures or staples will be removed at the office during your follow-up visit. °8. ACTIVITIES:  You may resume regular daily activities (gradually increasing) beginning the next day.  Wearing a good support bra or sports bra minimizes pain and swelling.  You may have sexual intercourse when it is comfortable. °a. You may drive when you no longer are taking prescription pain medication, you can comfortably wear a seatbelt, and you can safely maneuver your car and apply brakes. °b. RETURN TO WORK:  ______________________________________________________________________________________ °9. You should see your doctor in the office for a follow-up appointment approximately two weeks after your surgery.  Your doctor’s nurse will typically make your follow-up appointment when she calls you with your pathology report.  Expect your pathology report 2-3 business days after your surgery.  You may call to check if you do not hear from us after three days. °10. OTHER INSTRUCTIONS: _______________________________________________________________________________________________ _____________________________________________________________________________________________________________________________________ °_____________________________________________________________________________________________________________________________________ °_____________________________________________________________________________________________________________________________________ ° °WHEN TO CALL YOUR DOCTOR: °1. Fever over 101.0 °2. Nausea and/or vomiting. °3. Extreme swelling or bruising. °4. Continued bleeding from incision. °5. Increased pain, redness, or drainage from the incision. ° °The clinic staff is available to answer your questions during regular business hours.  Please don’t hesitate to call and ask to speak to one of the nurses for clinical concerns.  If you have a medical emergency, go to the nearest  emergency room or call 911.  A surgeon from [facility] is always on call at the hospital. ° °For further questions, please visit [email or web address]  ° ° ° °  Post Anesthesia Home Care Instructions ° °Activity: °Get plenty of rest for the remainder of the day. A responsible adult should stay with you for 24 hours following the procedure.  °For the next 24 hours, DO NOT: °-Drive a car °-Operate machinery °-Drink alcoholic beverages °-Take any medication unless instructed by your physician °-Make any legal decisions or sign important papers. ° °Meals: °Start with liquid foods such as gelatin or soup. Progress to regular foods as tolerated. Avoid greasy, spicy, heavy foods. If nausea and/or vomiting occur, drink only clear liquids until the nausea and/or vomiting subsides. Call your physician if vomiting continues. ° °Special Instructions/Symptoms: °Your throat may feel dry or sore from the anesthesia or the breathing tube placed in your throat during surgery. If this causes discomfort, gargle with warm salt water. The discomfort should disappear within 24 hours. ° °If you had a scopolamine patch placed behind your ear for the management of post- operative nausea and/or vomiting: ° °1. The medication in the patch is effective for 72 hours, after which it should be removed.  Wrap patch in a tissue and discard in the trash. Wash hands thoroughly with soap and water. °2. You may remove the patch earlier than 72 hours if you experience unpleasant side effects which may include dry mouth, dizziness or visual disturbances. °3. Avoid touching the patch. Wash your hands with soap and water after contact with the patch. °  ° °

## 2014-09-21 NOTE — Progress Notes (Signed)
Assisted Dr. Massagee with right, ultrasound guided, pectoralis block. Side rails up, monitors on throughout procedure. See vital signs in flow sheet. Tolerated Procedure well. 

## 2014-09-21 NOTE — Op Note (Signed)
Preoperative diagnosis: Stage I right breast cancer  Postoperative diagnosis: Same  Procedure: Right breast seed localized partial mastectomy with right axillary sentinel lymph node mapping with methylene blue dye  Surgeon: Erroll Luna M.D.  Anesthesia: LMA with pectoral block per anesthesia 0.25% Sensorcaine with epinephrine local  EBL: Minimal  Specimen: Right breast tissue with seating clip verified by radiograph pathology and one axillary sentinel node hot and blue  Drains: None  IV fluids: 700 mL crystalloid  Indications for procedure: The patient presents with right breast cancer for treatment. She was seen in the office we discussed breast conservation versus mastectomy with reconstruction surgical options. The pros and cons and treatment expectations of each were discussed. The complications of each were discussed. She wishes  to preserve her breast and presents today for breast conservation. The procedure has been discussed with the patient. Alternatives to surgery have been discussed with the patient.  Risks of surgery include bleeding,  Infection,  Seroma formation, death,  and the need for further surgery.   The patient understands and wishes to proceed. Sentinel lymph node mapping and dissection has been discussed with the patient.  Risk of bleeding,  Infection,  Seroma formation,  Additional procedures,,  Shoulder weakness ,  Shoulder stiffness,  Nerve and blood vessel injury and reaction to the mapping dyes have been discussed.  Alternatives to surgery have been discussed with the patient.  The patient agrees to proceed.  Description of procedure: Patient was met in holding area and questions were answered. Neoprobe used to identify seen in right breast in the right breast was marked as the correct side. She underwent pectoral block per anesthesia and injection of technetium sulfur colloid by radiology. Patient taken back to operating room and placed supine on the OR table.  After induction of LMA anesthesia the right breast is prepped and draped in sterile fashion. Timeout was done to verify proper procedure, side of patient. 4 mL of methylene blue dye were injected under the nipple. This was made with normal saline. Curvilinear incision was made along the nipple complex superiorly. The mass was just superior to the nipple complex the entire mass was excised in its entirety using a neoprobe as a guide. Radiograph revealed clip and seed in the specimen. I did shave excision of all the margins and sent this separately. The cavity is irrigated found to be hemostatic and closed with 3-0 Vicryl and 4-0 Monocryl. There is slight blue discoloration the skin around the area of dye injection.  . The neoprobe was switched to technetium. The right axilla was examined and hotspot identified. Transverse incision made in the right axilla and dissection carried down until a blue hot node identified and removed. This was level I. Background counts approached 0. Wound closed with 3-0 Vicryl and 4-0 Monocryl. Dermabond applied. Binder applied. All final counts the sponge, needle and sponge found to be correct this portion the case. Patient awoke taken recovery in satisfactory condition.

## 2014-09-21 NOTE — Interval H&P Note (Signed)
History and Physical Interval Note:  09/21/2014 11:00 AM  [facility] Howell  has presented today for surgery, with the diagnosis of Right Breast Cancer  The various methods of treatment have been discussed with the patient and family. After consideration of risks, benefits and other options for treatment, the patient has consented to  Procedure(s): RIGHT BREAST RADIOACTIVE SEED GUIDED PARTIAL MASTECTOMY WITH [name] (Right) as a surgical intervention .  The patient's history has been reviewed, patient examined, no change in status, stable for surgery.  I have reviewed the patient's chart and labs.  Questions were answered to the patient's satisfaction.     [name]

## 2014-09-21 NOTE — Anesthesia Postprocedure Evaluation (Signed)
  Anesthesia Post-op Note  Patient: Charlotte Howell  Procedure(s) Performed: Procedure(s): RIGHT BREAST RADIOACTIVE SEED GUIDED PARTIAL MASTECTOMY WITH AXILLARY SENTINEL LYMPH NODE MAPPING (Right)  Patient Location: PACU  Anesthesia Type:GA combined with regional for post-op pain  Level of Consciousness: awake and alert   Airway and Oxygen Therapy: Patient Spontanous Breathing  Post-op Pain: mild  Post-op Assessment: Post-op Vital signs reviewed              Post-op Vital Signs: stable  Last Vitals:  Filed Vitals:   09/21/14 1400  BP: 124/56  Pulse: 92  Temp:   Resp: 15    Complications: No apparent anesthesia complications

## 2014-09-21 NOTE — Anesthesia Preprocedure Evaluation (Signed)
Anesthesia Evaluation  Patient identified by MRN, date of birth, ID band Patient awake    Reviewed: Allergy & Precautions, NPO status , Patient's Chart, lab work & pertinent test results  History of Anesthesia Complications Negative for: history of anesthetic complications  Airway Mallampati: II  TM Distance: >3 FB Neck ROM: Full    Dental  (+) Teeth Intact   Pulmonary asthma , COPDCurrent Smoker,  breath sounds clear to auscultation        Cardiovascular hypertension, Rhythm:Regular Rate:Normal     Neuro/Psych  Neuromuscular disease    GI/Hepatic GERD-  ,  Endo/Other  diabetes  Renal/GU      Musculoskeletal  (+) Fibromyalgia -  Abdominal   Peds  Hematology   Anesthesia Other Findings   Reproductive/Obstetrics                             Anesthesia Physical Anesthesia Plan  ASA: III  Anesthesia Plan: General   Post-op Pain Management: GA combined w/ Regional for post-op pain   Induction:   Airway Management Planned: LMA  Additional Equipment:   Intra-op Plan:   Post-operative Plan: Extubation in OR  Informed Consent: I have reviewed the patients History and Physical, chart, labs and discussed the procedure including the risks, benefits and alternatives for the proposed anesthesia with the patient or authorized representative who has indicated his/her understanding and acceptance.   Dental advisory given  Plan Discussed with: CRNA and Surgeon  Anesthesia Plan Comments:         Anesthesia Quick Evaluation

## 2014-09-21 NOTE — Anesthesia Procedure Notes (Signed)
Procedure Name: LMA Insertion Date/Time: 09/21/2014 11:44 AM Performed by: Maryella Shivers Pre-anesthesia Checklist: Patient identified, Emergency Drugs available, Suction available and Patient being monitored Patient Re-evaluated:Patient Re-evaluated prior to inductionOxygen Delivery Method: Circle System Utilized Preoxygenation: Pre-oxygenation with 100% oxygen Intubation Type: IV induction Ventilation: Mask ventilation without difficulty LMA: LMA inserted LMA Size: 4.0 Number of attempts: 1 Airway Equipment and Method: Bite block Placement Confirmation: positive ETCO2 Tube secured with: Tape Dental Injury: Teeth and Oropharynx as per pre-operative assessment

## 2014-09-21 NOTE — Transfer of Care (Signed)
Immediate Anesthesia Transfer of Care Note  Patient: [facility] Howell  Procedure(s) Performed: Procedure(s): RIGHT BREAST RADIOACTIVE SEED GUIDED PARTIAL MASTECTOMY WITH AXILLARY SENTINEL LYMPH NODE MAPPING (Right)  Patient Location: PACU  Anesthesia Type:GA combined with regional for post-op pain  Level of Consciousness: sedated  Airway & Oxygen Therapy: Patient Spontanous Breathing and Patient connected to face mask oxygen  Post-op Assessment: Report given to RN and Post -op Vital signs reviewed and stable  Post vital signs: Reviewed and stable  Last Vitals:  Filed Vitals:   09/21/14 1245  BP:   Pulse: 94  Temp:   Resp: 19    Complications: No apparent anesthesia complications

## 2014-09-21 NOTE — H&P (View-Only) (Signed)
Charlotte Howell 08/29/2014 4:11 PM Location: [facility] Patient #: [id number] DOB: June 24, 1950 Widowed / Language: Charlotte Howell / Race: White Female History of Present Illness [facility] Howell A. [facility] Wurtz MD; 08/29/2014 5:07 PM) Patient words: discuss surgery  PT HERE FOR FOLLOW UP OF STAGE 1 RIGHT BREAST CANCER. SHE HAD 2 MORE RIGHT BREAST BIOPSIES AND THESE WERE FIBROCYSTIC DISEASE. sHE WOULD LIKE TO PROCEED WITH RIGHT BREAST PARTIAL MASTECTOMY AND sln MAPPING OVER MASTECTOMY.            1. Breast, right, needle core biopsy, UOQ - FIBROCYSTIC CHANGES. - THERE IS NO EVIDENCE OF MALIGNANCY. - SEE COMMENT. 2. Breast, right, needle core biopsy, UIQ - FIBROADENOMA. - THERE IS NO EVIDENCE OF MALIGNANCY. - SEE COMMENT.             CLINICAL DATA: Newly diagnosed right breast 12 o'clock location malignancy at [employer], [place].  LABS: BUN and creatinine were obtained on site at [facility] at  [address] [place].  Results: BUN for mg/dL, Creatinine 0.7 mg/dL.  EXAM: BILATERAL BREAST MRI WITH AND WITHOUT CONTRAST  TECHNIQUE: Multiplanar, multisequence MR images of both breasts were obtained prior to and following the intravenous administration of 15 ml of MultiHance.  THREE-DIMENSIONAL MR IMAGE RENDERING ON INDEPENDENT WORKSTATION:  Three-dimensional MR images were rendered by post-processing of the original MR data on an independent workstation. The three-dimensional MR images were interpreted, and findings are reported in the following complete MRI report for this study. Three dimensional images were evaluated at the independent DynaCad workstation  COMPARISON: Outside prior mammograms and right breast ultrasound  FINDINGS: Breast composition: d. Extreme fibroglandular tissue.  Background parenchymal enhancement: Mild  Right breast: In the right breast 12 o'clock location is an area of irregular masslike enhancement measuring 1.9 by  0.6 x 1.4 cm, demonstrating progressive enhancement type kinetics. This contains central clip artifact and corresponds to the biopsy-proven malignancy. In the right breast 10 o'clock location, 1 cm lateral to this biopsy proven malignancy, is a 0.6 cm area of irregular masslike enhancement also demonstrating similar progressive enhancement type kinetics.  Left breast: No mass or abnormal enhancement.  Lymph nodes: No abnormal appearing lymph nodes.  Ancillary findings: None.  IMPRESSION: Dominant 1.9 cm area of masslike abnormal enhancement right breast 12 o'clock location corresponding to biopsy-proven malignancy.  In the right breast 10 o'clock location, located 1 cm lateral to the dominant right breast 12 o'clock location mass, is a 0.6 cm irregular area of masslike enhancement. This could represent multi focal malignancy or reactive versus metastatic intramammary lymph node. Consider MRI guided breast biopsy if breast conservation is considered. The patient previously underwent sonographic evaluation of this general location and no sonographic abnormality was reportedly previously seen.  No MRI evidence for malignancy in the left breast.  RECOMMENDATION: MRI guided right breast biopsy  BI-RADS CATEGORY 6: Known biopsy-proven malignancy.   Electronically Signed By: [facility] Howell M.D. On: 07/18/2014 15:40.  The patient is a 64 year old female   Medication History Charlotte Howell, CMA; 08/29/2014 4:16 PM) ALPRAZolam (1MG  Tablet, Oral) Active. TraMADol HCl (50MG  Tablet, Oral) Active. Cyclobenzaprine HCl (10MG  Tablet, Oral) Active. Dexilant (60MG  Capsule DR, Oral) Active. Myrbetriq (25MG  Tablet ER 24HR, Oral) Active. Primidone (50MG  Tablet, Oral) Active. ROPINIRole HCl (0.5MG  Tablet, Oral) Active. Symbicort (160-4.5MCG/ACT Aerosol, Inhalation) Active. ZyrTEC Allergy (10MG  Capsule, Oral) Active. Albuterol Sulfate (0.63MG /3ML Nebulized Soln,  Inhalation) Active. Aspirin (81MG  Tablet, Oral) Active. Tylenol Extra Strength (500MG  Tablet, Oral) Active. Hydrochlorothiazide (25MG  Tablet, Oral) Active.  MetFORMIN HCl (500MG  Tablet, Oral two times daily) Active. Red Yeast Rice Extract (600MG  Capsule, Oral daily) Active. Medications Reconciled    Vitals [facility] Howell CMA; 08/29/2014 4:12 PM) 08/29/2014 4:12 PM Weight: 159.8 lb Height: 64in Body Surface Area: 1.81 m Body Mass Index: 27.43 kg/m Temp.: 97.18F(Oral)  Pulse: 100 (Regular)  BP: 138/76 (Sitting, Left Arm, Standard)     Physical Exam ([facility] Angell A. Meilin Brosh MD; 08/29/2014 5:08 PM)  General Mental Status-Alert. General Appearance-Consistent with stated age. Hydration-Well hydrated. Voice-Normal.  Eye Eyeball - Bilateral-Extraocular movements intact. Sclera/Conjunctiva - Bilateral-No scleral icterus.  Breast Note: NOT REEXAMINED TODAY   Neurologic Neurologic evaluation reveals -alert and oriented x 3 with no impairment of recent or remote memory. Mental Status-Normal.  Musculoskeletal Normal Exam - Left-Upper Extremity Strength Normal and Lower Extremity Strength Normal. Normal Exam - Right-Upper Extremity Strength Normal and Lower Extremity Strength Normal.  Lymphatic Note: NO CHANGE FROM PREVIOUS EXAM     Assessment & Plan ([facility] Durnil A. Analyce Tavares MD; 08/29/2014 5:09 PM)  BREAST CANCER, RIGHT (174.9  C50.911) Impression: 1.9 CM ON MRI er/pr + UPPER OUTER QUADRANT THE OTHER SITES IN RIGHT BREAST ARE FIBROCYSTIC DISEASE PT DESIRES BREAST CONSERVATION AND WANTS HER CARE IN [place] WILL REFER TO DR Charlotte Howell IN [place] AND CAN GET RADIATION THERAPY IN [place]    Risk of lumpectomy include bleeding, infection, seroma, more surgery, use of seed/wire, wound care, cosmetic deformity and the need for other treatments, death , blood clots, death. Pt agrees to proceed. Risk of sentinel lymph node mapping include bleeding,  infection, lymphedema, shoulder pain. stiffness, dye allergy. cosmetic deformity , blood clots, death, need for more surgery. Pt agres to proceed.  Current Plans Pt Education - Breast Cancer: discussed with patient and provided information. Pt Education - [facility] Breast Biopsy [facility] Pt Education - [facility] Breast Biopsy Instructions (FLB): discussed with patient and provided information.

## 2014-09-22 ENCOUNTER — Encounter (HOSPITAL_BASED_OUTPATIENT_CLINIC_OR_DEPARTMENT_OTHER): Payer: Self-pay | Admitting: Surgery

## 2017-09-25 ENCOUNTER — Other Ambulatory Visit: Payer: Self-pay | Admitting: Family Medicine

## 2017-09-25 DIAGNOSIS — Z853 Personal history of malignant neoplasm of breast: Secondary | ICD-10-CM

## 2017-10-02 ENCOUNTER — Ambulatory Visit
Admission: RE | Admit: 2017-10-02 | Discharge: 2017-10-02 | Disposition: A | Discharge status: Home / Self Care | Payer: Medicare Other | Source: Ambulatory Visit | Attending: Family Medicine | Admitting: Family Medicine

## 2017-10-02 DIAGNOSIS — Z853 Personal history of malignant neoplasm of breast: Secondary | ICD-10-CM

## 2017-10-02 HISTORY — DX: Personal history of irradiation: Z92.3

## 2018-03-18 ENCOUNTER — Other Ambulatory Visit: Payer: Self-pay | Admitting: Family Medicine

## 2018-03-18 DIAGNOSIS — Z853 Personal history of malignant neoplasm of breast: Secondary | ICD-10-CM

## 2018-10-05 ENCOUNTER — Other Ambulatory Visit: Payer: Self-pay

## 2018-10-05 ENCOUNTER — Ambulatory Visit
Admission: RE | Admit: 2018-10-05 | Discharge: 2018-10-05 | Disposition: A | Discharge status: Home / Self Care | Payer: Medicare Other | Source: Ambulatory Visit | Attending: Family Medicine | Admitting: Family Medicine

## 2018-10-05 DIAGNOSIS — Z853 Personal history of malignant neoplasm of breast: Secondary | ICD-10-CM

## 2019-08-24 ENCOUNTER — Other Ambulatory Visit: Payer: Self-pay | Admitting: Family Medicine

## 2019-08-24 DIAGNOSIS — Z9889 Other specified postprocedural states: Secondary | ICD-10-CM

## 2019-10-07 ENCOUNTER — Other Ambulatory Visit: Payer: Self-pay

## 2019-10-07 ENCOUNTER — Ambulatory Visit
Admission: RE | Admit: 2019-10-07 | Discharge: 2019-10-07 | Disposition: A | Discharge status: Home / Self Care | Payer: Medicare Other | Source: Ambulatory Visit | Attending: Family Medicine | Admitting: Family Medicine

## 2019-10-07 DIAGNOSIS — Z9889 Other specified postprocedural states: Secondary | ICD-10-CM

## 2020-03-15 IMAGING — MG DIGITAL DIAGNOSTIC BILATERAL MAMMOGRAM WITH TOMO AND CAD
6 of 11 series · 6 of 31 positions shown · non-contrast
Comparison: Previous exam(s).
COMPARISON: Previous exam(s).

Addendum:
CLINICAL DATA: Patient with history of right breast cancer status
post lumpectomy 7375.

EXAM:
DIGITAL DIAGNOSTIC BILATERAL MAMMOGRAM WITH CAD AND TOMO

[R MLO]
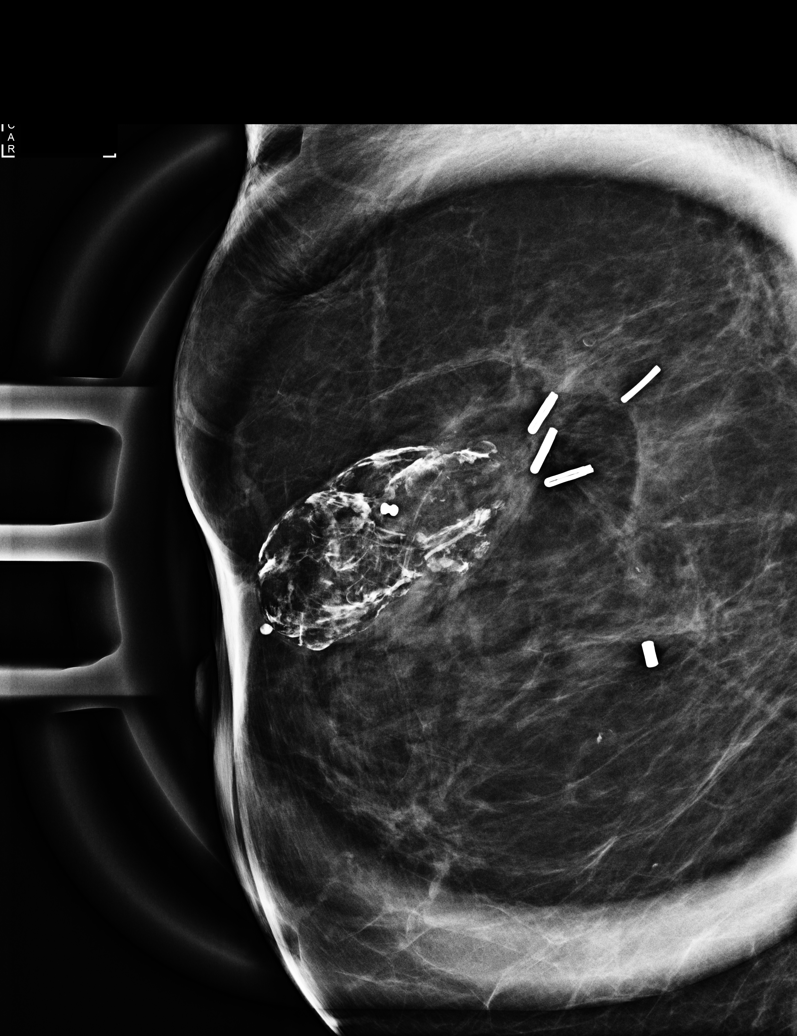

[R CC synth-2D]
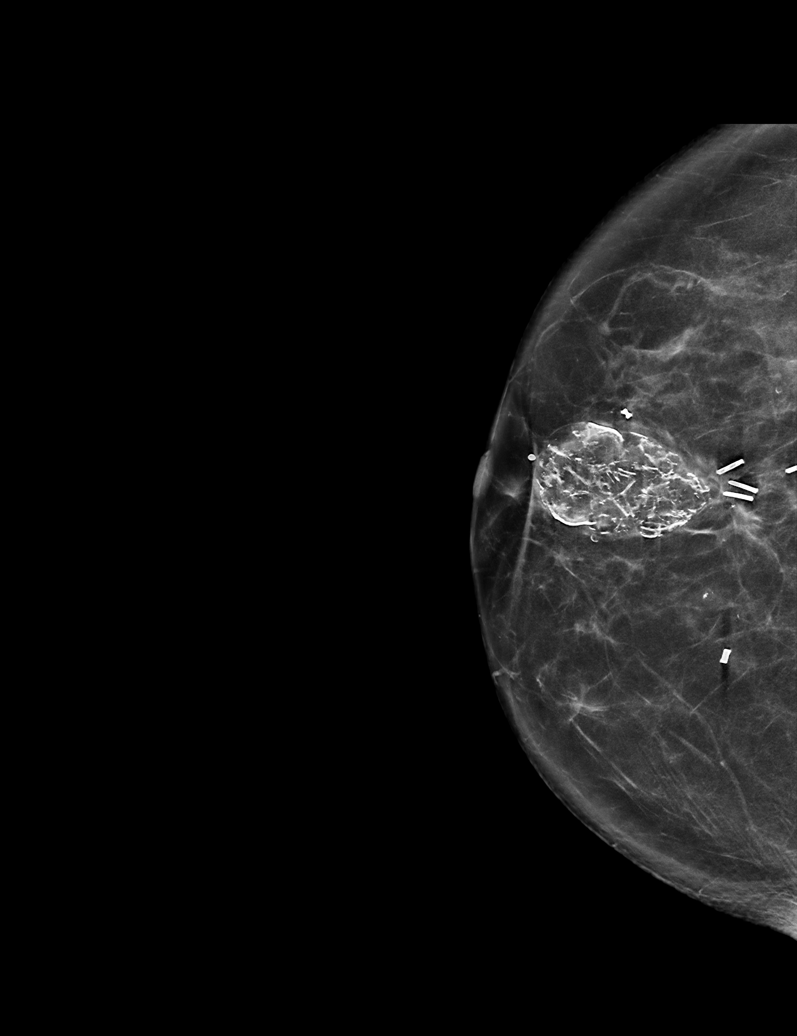

[L MLO synth-2D (1 of 2)]
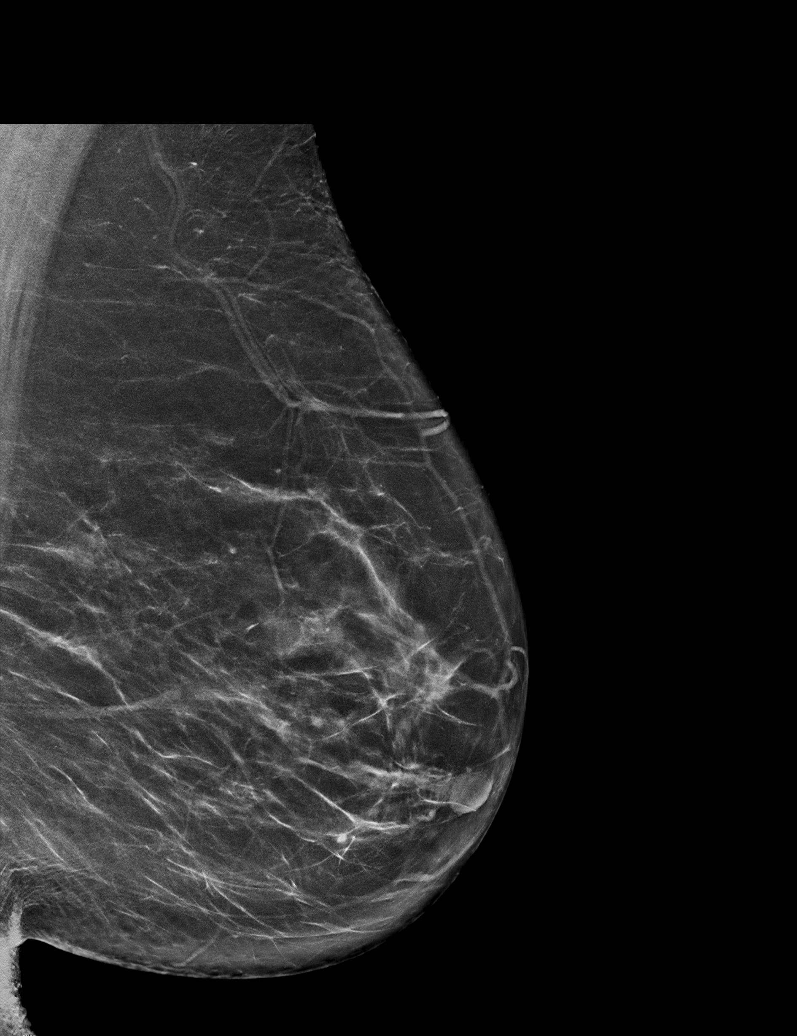

[L CC synth-2D]
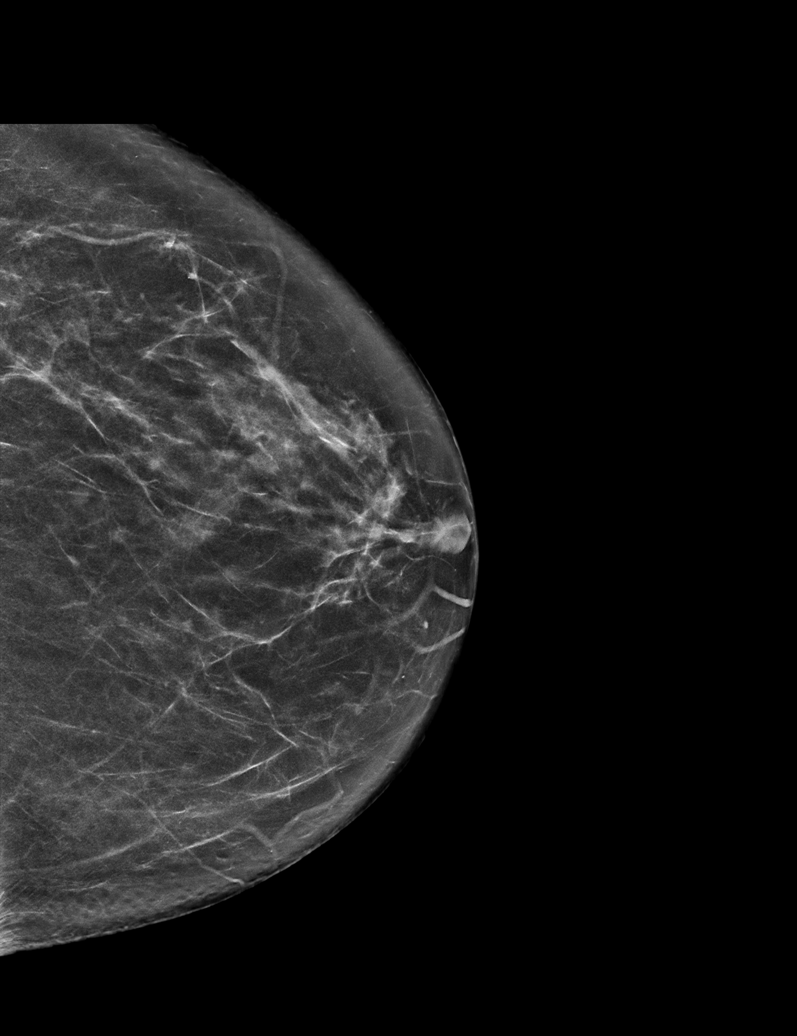

[L MLO synth-2D (2 of 2)]
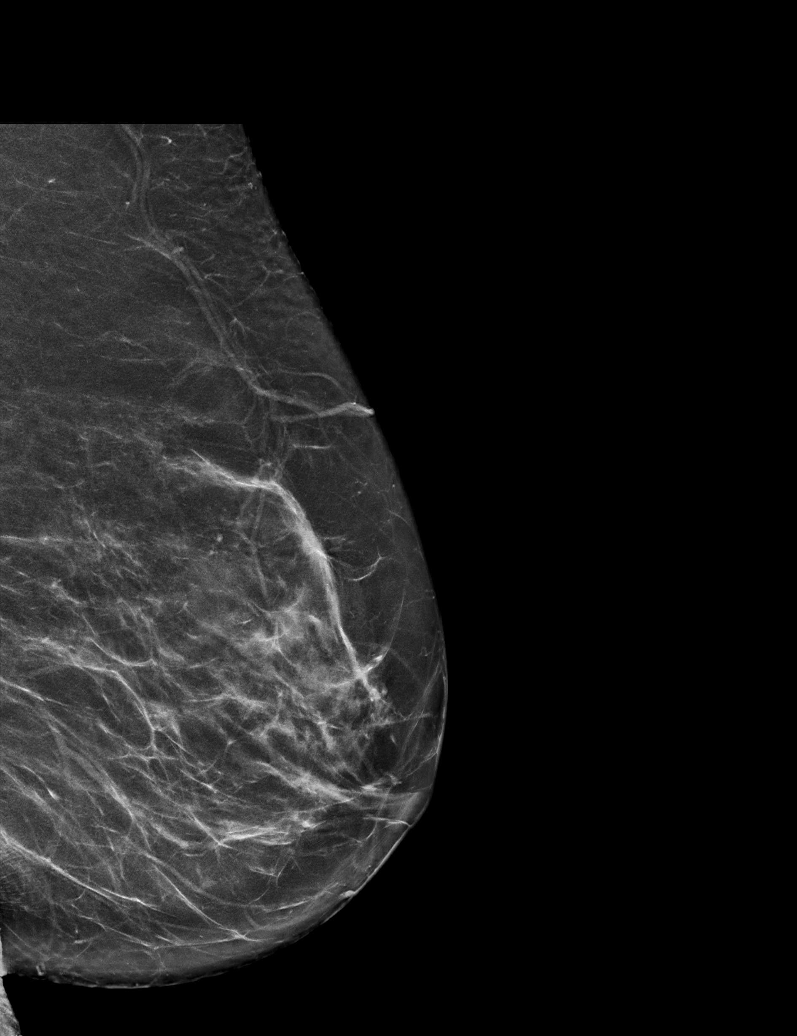

[R MLO synth-2D]
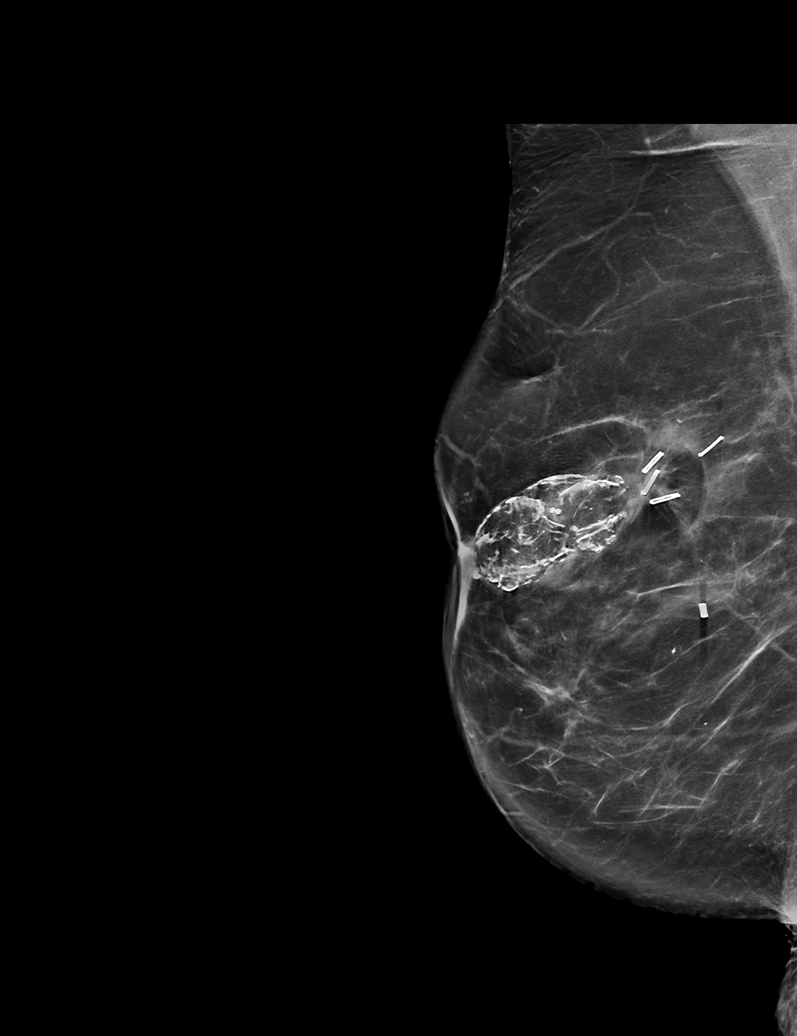

[6 of 31 positions shown; findings below may reference images not displayed]

ACR Breast Density Category c: The breast tissue is heterogeneously
dense, which may obscure small masses.
FINDINGS: Stable postlumpectomy changes right breast. No new masses,
calcifications or nonsurgical distortion identified.

Mammographic images were processed with CAD.
IMPRESSION: Stable right breast lumpectomy changes.

RECOMMENDATION:
Bilateral diagnostic mammography in 12 months.

Recommend obtaining [id number] mammograms from [HOSPITAL] and comparing with
current exam.

I have discussed the findings and recommendations with the patient.
Results were also provided in writing at the conclusion of the
visit. If applicable, a reminder letter will be sent to the patient
regarding the next appointment.

BI-RADS CATEGORY  0: Incomplete. Need additional imaging evaluation
and/or prior mammograms for comparison.

ADDENDUM:
Outside prior mammogram images dated 06/09/2016 were obtained and
compared with the original diagnostic exam. No change in the
original report.

Recommendation:

Bilateral diagnostic mammography in 12 months.

BI-RADS category:

2: Benign.

*** End of Addendum ***
ACR Breast Density Category c: The breast tissue is heterogeneously
dense, which may obscure small masses.
FINDINGS: Stable postlumpectomy changes right breast. No new masses,
calcifications or nonsurgical distortion identified.

Mammographic images were processed with CAD.
IMPRESSION: Stable right breast lumpectomy changes.

RECOMMENDATION:
Bilateral diagnostic mammography in 12 months.

Recommend obtaining [id number] mammograms from [HOSPITAL] and comparing with
current exam.

I have discussed the findings and recommendations with the patient.
Results were also provided in writing at the conclusion of the
visit. If applicable, a reminder letter will be sent to the patient
regarding the next appointment.

BI-RADS CATEGORY  0: Incomplete. Need additional imaging evaluation
and/or prior mammograms for comparison.

## 2023-01-23 ENCOUNTER — Other Ambulatory Visit (HOSPITAL_COMMUNITY): Payer: Self-pay | Admitting: Neurological Surgery

## 2023-01-23 DIAGNOSIS — M51362 Other intervertebral disc degeneration, lumbar region with discogenic back pain and lower extremity pain: Secondary | ICD-10-CM

## 2023-01-31 ENCOUNTER — Ambulatory Visit (HOSPITAL_COMMUNITY)
Admission: RE | Admit: 2023-01-31 | Discharge: 2023-01-31 | Disposition: A | Discharge status: Home / Self Care | Payer: Medicare Other | Source: Ambulatory Visit | Attending: Neurological Surgery | Admitting: Neurological Surgery

## 2023-01-31 DIAGNOSIS — M51362 Other intervertebral disc degeneration, lumbar region with discogenic back pain and lower extremity pain: Secondary | ICD-10-CM | POA: Insufficient documentation
